# Patient Record
Sex: Female | Born: 1970 | Race: Black or African American | Hispanic: No | Marital: Single
Health system: Southern US, Community
[De-identification: ages and names within clinical notes are randomized; demographics above are authoritative.]

---

## 2010-04-10 ENCOUNTER — Encounter: Admission: RE | Admit: 2010-04-10 | Discharge: 2010-04-10 | Payer: Self-pay | Admitting: Podiatry

## 2014-03-10 DIAGNOSIS — R1900 Intra-abdominal and pelvic swelling, mass and lump, unspecified site: Secondary | ICD-10-CM | POA: Insufficient documentation

## 2014-05-19 DIAGNOSIS — D251 Intramural leiomyoma of uterus: Secondary | ICD-10-CM | POA: Insufficient documentation

## 2014-08-23 ENCOUNTER — Telehealth: Payer: Self-pay | Admitting: Critical Care Medicine

## 2014-08-23 NOTE — Telephone Encounter (Signed)
Spoke with [employer] of PFT need to be signed by PW and faxed back to [place]  I do not see that the PFT was scanned in yet  Do you have it, CJ? Please advise thanks!

## 2014-08-24 NOTE — Telephone Encounter (Signed)
lmomtcb on Misty White's named VM --  I do not see where we have seen this pt and there's no pending appt.   Who ordered this pt's PFT?

## 2014-08-25 NOTE — Telephone Encounter (Signed)
I spoke with Venida Jarvis - she will check on below and will call office back.

## 2014-08-29 NOTE — Telephone Encounter (Signed)
lmomtcb for Sherri to check on below.

## 2014-08-31 NOTE — Telephone Encounter (Signed)
Misty White returned call. She states they initially gave [place] wrong pt's name.  We would not have a pft result for this pt. Should not be any more follow ups.

## 2014-08-31 NOTE — Telephone Encounter (Signed)
LM for Misty White top return call

## 2014-08-31 NOTE — Telephone Encounter (Signed)
Will sign off on message since this is the incorrect patient.

## 2016-11-22 ENCOUNTER — Other Ambulatory Visit: Payer: Self-pay | Admitting: Orthopedic Surgery

## 2016-11-22 DIAGNOSIS — M533 Sacrococcygeal disorders, not elsewhere classified: Secondary | ICD-10-CM

## 2016-12-02 ENCOUNTER — Ambulatory Visit
Admission: RE | Admit: 2016-12-02 | Discharge: 2016-12-02 | Disposition: A | Discharge status: Home / Self Care | Payer: Worker's Compensation | Source: Ambulatory Visit | Attending: Orthopedic Surgery | Admitting: Orthopedic Surgery

## 2016-12-02 DIAGNOSIS — M533 Sacrococcygeal disorders, not elsewhere classified: Secondary | ICD-10-CM

## 2016-12-31 ENCOUNTER — Other Ambulatory Visit: Payer: Self-pay | Admitting: Orthopedic Surgery

## 2016-12-31 DIAGNOSIS — M533 Sacrococcygeal disorders, not elsewhere classified: Secondary | ICD-10-CM

## 2017-01-13 ENCOUNTER — Other Ambulatory Visit: Payer: Self-pay | Admitting: Orthopedic Surgery

## 2017-01-13 ENCOUNTER — Ambulatory Visit
Admission: RE | Admit: 2017-01-13 | Discharge: 2017-01-13 | Disposition: A | Discharge status: Home / Self Care | Payer: Worker's Compensation | Source: Ambulatory Visit | Attending: Orthopedic Surgery | Admitting: Orthopedic Surgery

## 2017-01-13 DIAGNOSIS — M533 Sacrococcygeal disorders, not elsewhere classified: Secondary | ICD-10-CM

## 2017-02-07 DIAGNOSIS — M222X2 Patellofemoral disorders, left knee: Secondary | ICD-10-CM | POA: Insufficient documentation

## 2017-10-29 IMAGING — CT CT BIOPSY
1 of 6 series · 8 of 32 positions shown, 13 images · non-contrast
Comparison: none

CLINICAL DATA: Bilateral posterior pelvic pain following fall at
work 8 months ago.

[Series 3: si joint local · axial · 0.98mm/px · z∈[-186,-116]mm · 8 of 47 slices shown, 13 images]
[im 6/47  soft-tissue]
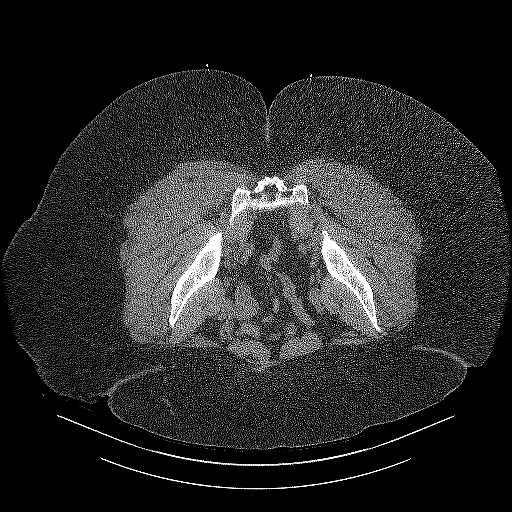
[im 6/47  bone]
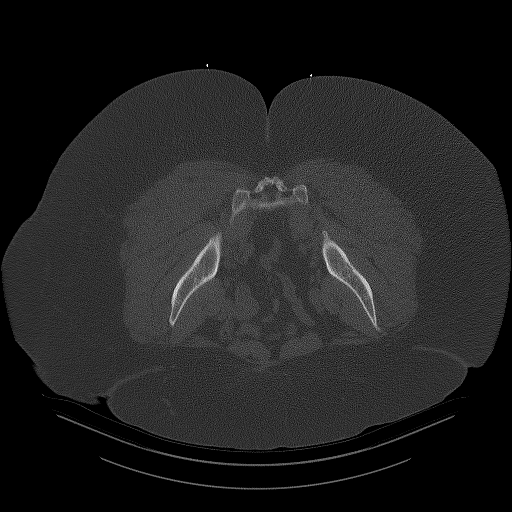
[im 11/47  soft-tissue]
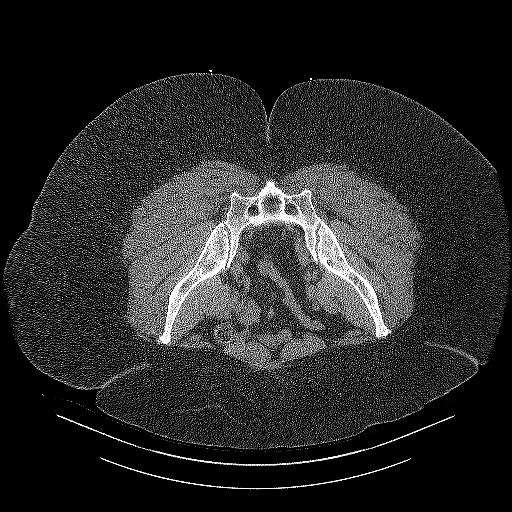
[im 16/47  soft-tissue]
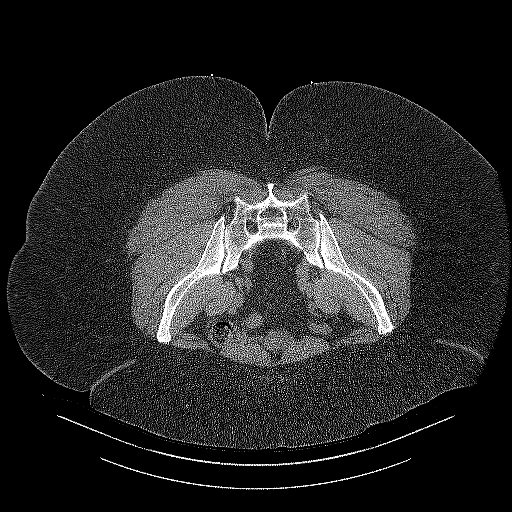
[im 21/47  soft-tissue]
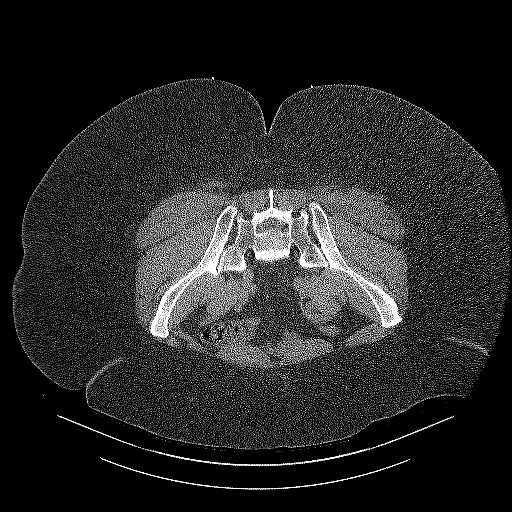
[im 26/47  soft-tissue]
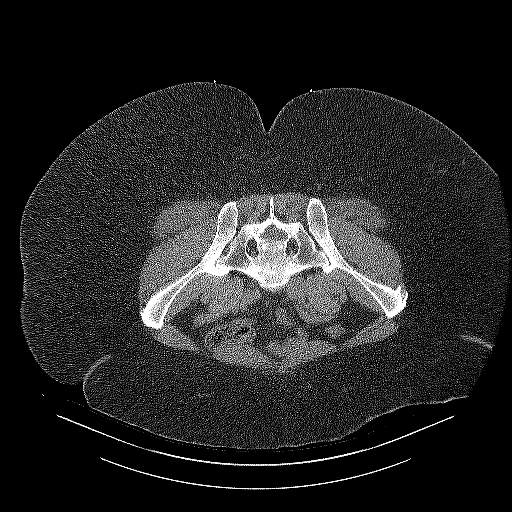
[im 26/47  lung]
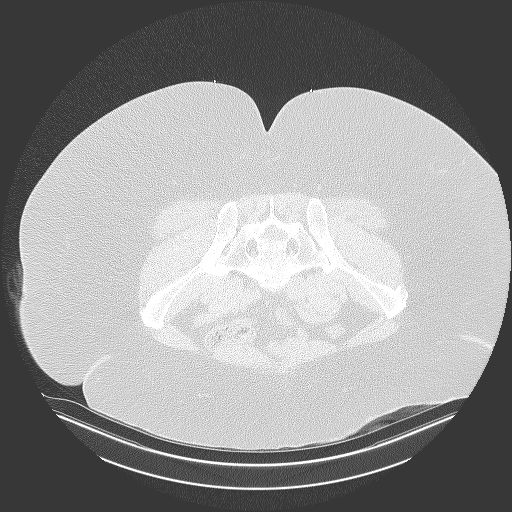
[im 31/47  soft-tissue]
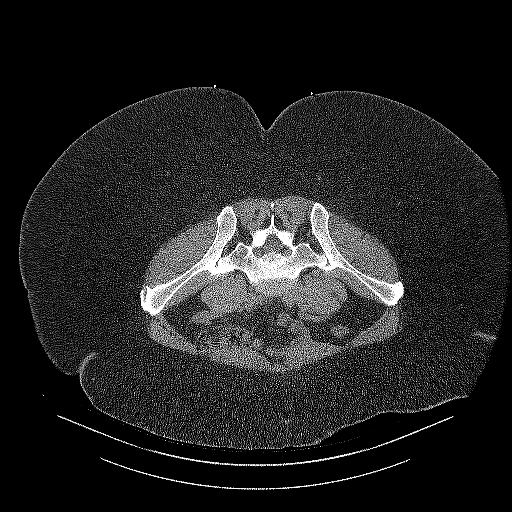
[im 31/47  lung]
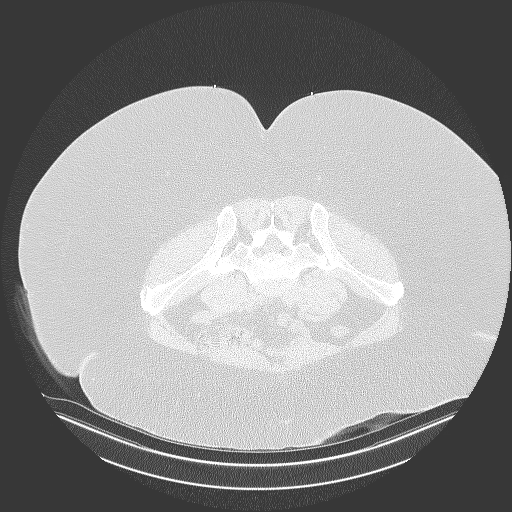
[im 36/47  soft-tissue]
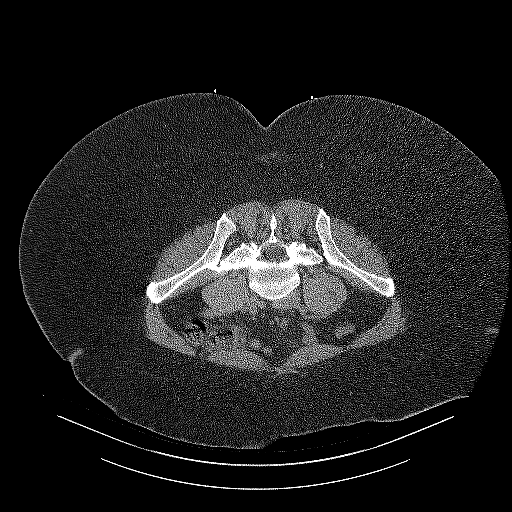
[im 36/47  lung]
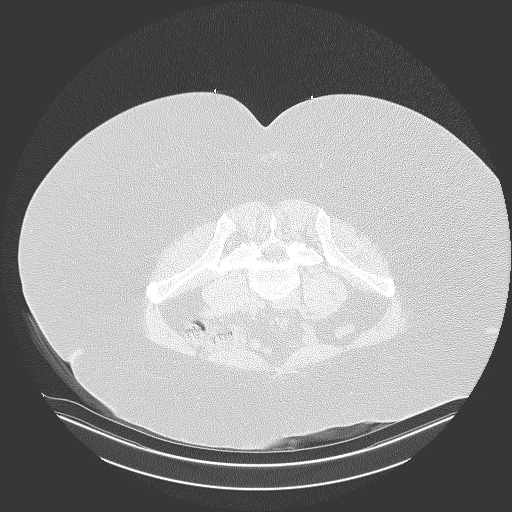
[im 41/47  soft-tissue]
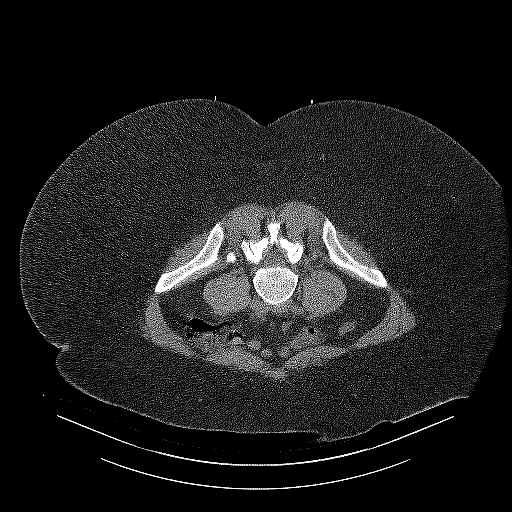
[im 41/47  lung]
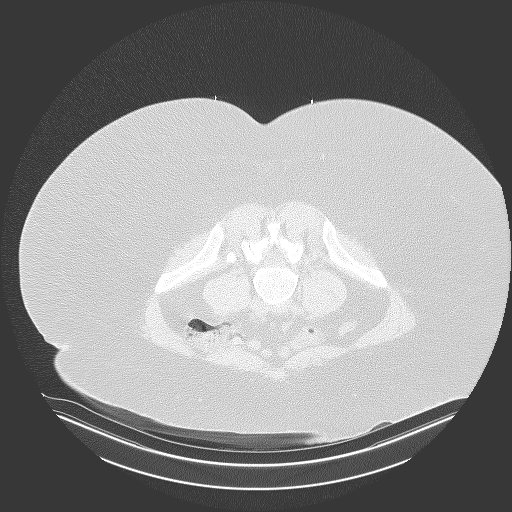

[8 of 32 positions shown; findings below may reference images not displayed]

EXAM:
Bilateral CT GUIDED SI JOINT INJECTION



After local anesthesia with 1% lidocaine without epinephrine and
subsequent deep anesthesia, a 22 gauge spinal needle was advanced
into the left SI joint under intermittent CT guidance. A second 22
gauge spinal needle was advanced into the right SI joint under
intermittent CT guidance at the same time.

Once the needles were in satisfactory position, a representative
image was captured with the needles demonstrated in the sacroiliac
joints. Subsequently, 2 mL 1% lidocaine was injected into the left
SI joint. 2 mL 1% lidocaine was then injected into the right SI
joint Needles or removed and a sterile dressing applied.

No complications were observed.
IMPRESSION: Successful CT-guided bilateral SI joint injection.

Lidocaine only was injected. The patient was advised to Our[facility] her
responses for 6 hours following the injection.

## 2020-02-18 ENCOUNTER — Ambulatory Visit: Payer: Self-pay | Admitting: Podiatry

## 2021-02-15 ENCOUNTER — Ambulatory Visit (INDEPENDENT_AMBULATORY_CARE_PROVIDER_SITE_OTHER): Payer: BC Managed Care – PPO

## 2021-02-15 ENCOUNTER — Ambulatory Visit: Payer: BC Managed Care – PPO | Admitting: Podiatry

## 2021-02-15 ENCOUNTER — Other Ambulatory Visit: Payer: Self-pay

## 2021-02-15 ENCOUNTER — Encounter: Payer: Self-pay | Admitting: Podiatry

## 2021-02-15 DIAGNOSIS — R102 Pelvic and perineal pain: Secondary | ICD-10-CM | POA: Insufficient documentation

## 2021-02-15 DIAGNOSIS — B9689 Other specified bacterial agents as the cause of diseases classified elsewhere: Secondary | ICD-10-CM | POA: Insufficient documentation

## 2021-02-15 DIAGNOSIS — M79672 Pain in left foot: Secondary | ICD-10-CM | POA: Diagnosis not present

## 2021-02-15 DIAGNOSIS — S4991XA Unspecified injury of right shoulder and upper arm, initial encounter: Secondary | ICD-10-CM | POA: Insufficient documentation

## 2021-02-15 DIAGNOSIS — M722 Plantar fascial fibromatosis: Secondary | ICD-10-CM

## 2021-02-15 DIAGNOSIS — M79671 Pain in right foot: Secondary | ICD-10-CM

## 2021-02-15 DIAGNOSIS — M216X9 Other acquired deformities of unspecified foot: Secondary | ICD-10-CM

## 2021-02-15 DIAGNOSIS — M62838 Other muscle spasm: Secondary | ICD-10-CM | POA: Insufficient documentation

## 2021-02-22 NOTE — Progress Notes (Signed)
  Subjective:  Patient ID: Misty White, female    DOB: Jan 18, 1971,  MRN: [id number]  Chief Complaint  Patient presents with   Foot Pain    I have some swelling and my feet feel like they are pulling and hurt in the heel area and on the sides    50 y.o. female presents with the above complaint. History confirmed with patient.  Reports painful areas to the bottom of both heels present for 1 month.  Reports cramping at night and pain going up the legs.  States that she stands on her feet for 8 hours at work and it hurts worse with standing.  Objective:  Physical Exam: warm, good capillary refill, no trophic changes or ulcerative lesions, normal DP and PT pulses, and normal sensory exam.  Pain palpation about both medial calcaneal tuber's mild pain at the arch decreased ankle joint range of motion bilateral   Radiographs: X-ray of both feet: no fracture, dislocation, swelling or degenerative changes noted Assessment:   1. Plantar fasciitis   2. Equinus deformity of foot      Plan:  Patient was evaluated and treated and all questions answered.  Plantar Fasciitis -XR reviewed with patient -Educated patient on stretching and icing of the affected limb -Plantar fascial brace dispensed x2  No follow-ups on file.

## 2021-03-08 ENCOUNTER — Ambulatory Visit (INDEPENDENT_AMBULATORY_CARE_PROVIDER_SITE_OTHER): Payer: BC Managed Care – PPO | Admitting: Podiatry

## 2021-03-08 DIAGNOSIS — Z5329 Procedure and treatment not carried out because of patient's decision for other reasons: Secondary | ICD-10-CM

## 2021-09-27 ENCOUNTER — Ambulatory Visit: Payer: BC Managed Care – PPO | Admitting: Podiatry

## 2021-10-01 ENCOUNTER — Ambulatory Visit: Payer: BC Managed Care – PPO | Admitting: Podiatry

## 2021-10-15 ENCOUNTER — Other Ambulatory Visit: Payer: Self-pay

## 2021-10-15 ENCOUNTER — Ambulatory Visit (INDEPENDENT_AMBULATORY_CARE_PROVIDER_SITE_OTHER): Payer: BC Managed Care – PPO | Admitting: Podiatry

## 2021-10-15 ENCOUNTER — Encounter: Payer: Self-pay | Admitting: Podiatry

## 2021-10-15 DIAGNOSIS — R5382 Chronic fatigue, unspecified: Secondary | ICD-10-CM | POA: Insufficient documentation

## 2021-10-15 DIAGNOSIS — M722 Plantar fascial fibromatosis: Secondary | ICD-10-CM | POA: Diagnosis not present

## 2021-10-15 DIAGNOSIS — E039 Hypothyroidism, unspecified: Secondary | ICD-10-CM | POA: Insufficient documentation

## 2021-10-15 DIAGNOSIS — E119 Type 2 diabetes mellitus without complications: Secondary | ICD-10-CM | POA: Insufficient documentation

## 2021-10-15 DIAGNOSIS — N390 Urinary tract infection, site not specified: Secondary | ICD-10-CM | POA: Insufficient documentation

## 2021-10-15 DIAGNOSIS — M216X9 Other acquired deformities of unspecified foot: Secondary | ICD-10-CM

## 2021-10-15 DIAGNOSIS — I10 Essential (primary) hypertension: Secondary | ICD-10-CM | POA: Insufficient documentation

## 2021-10-15 DIAGNOSIS — Z0000 Encounter for general adult medical examination without abnormal findings: Secondary | ICD-10-CM | POA: Insufficient documentation

## 2021-10-15 DIAGNOSIS — R059 Cough, unspecified: Secondary | ICD-10-CM | POA: Insufficient documentation

## 2021-10-15 DIAGNOSIS — E559 Vitamin D deficiency, unspecified: Secondary | ICD-10-CM | POA: Insufficient documentation

## 2021-10-15 DIAGNOSIS — D518 Other vitamin B12 deficiency anemias: Secondary | ICD-10-CM | POA: Insufficient documentation

## 2021-10-15 DIAGNOSIS — E782 Mixed hyperlipidemia: Secondary | ICD-10-CM | POA: Insufficient documentation

## 2021-10-15 MED ORDER — DEXAMETHASONE SODIUM PHOSPHATE 120 MG/30ML IJ SOLN: 4.0000 mg | Freq: Once | INTRAMUSCULAR | Status: AC

## 2021-10-15 NOTE — Progress Notes (Signed)
?  Subjective:  ?Patient ID: Misty White, female    DOB: 1971/06/05,   MRN: [id number] ? ?Chief Complaint  ?Patient presents with  ? Foot Pain  ?  Feet are terrible and heels are bad and pin and needles and shots did not help  ? ? ?51 y.o. female presents for follow-up of bilateral heel pain. Relates they have been really painful. The braces help a little but she is on her feet all day and that causes pain. She is to be undergoing weight loss surgery soon. She alos relates some pain with calluses and toenails.  . Denies any other pedal complaints. Denies n/v/f/c.  ? ?History reviewed. No pertinent past medical history. ? ?Objective:  ?Physical Exam: ?Vascular: DP/PT pulses 2/4 bilateral. CFT <3 seconds. Normal hair growth on digits. No edema.  ?Skin. No lacerations or abrasions bilateral feet.  ?Musculoskeletal: MMT 5/5 bilateral lower extremities in DF, PF, Inversion and Eversion. Deceased ROM in DF of ankle joint.  Bilateral medial calcaneal tubercles tender to palpation. No pain with calcaneal squeeze.  ?Neurological: Sensation intact to light touch.  ? ?Assessment:  ? ?1. Plantar fasciitis   ?2. Equinus deformity of foot   ? ? ? ?Plan:  ?Patient was evaluated and treated and all questions answered. ?Discussed plantar fasciitis with patient.  ?X-rays reviewed and discussed with patient. No acute fractures or dislocations noted. Mild spurring noted at inferior calcaneus.  ?Discussed treatment options including, ice, NSAIDS, supportive shoes, bracing, and stretching. ?Continue stretching and braces and anti-inflammatories.  ?Nails 1-5 debrided as courtesy.  ?Patient requesting injection today. Procedure note below.   ?Follow-up 6 weeks or sooner if any problems arise. In the meantime, encouraged to call the office with any questions, concerns, change in symptoms.  ? ?Procedure:  ?Discussed etiology, pathology, conservative vs. surgical therapies. At this time a plantar fascial injection was recommended.  The  patient agreed and a sterile skin prep was applied.  An injection consisting of  dexamethasone and marcaine mixture was infiltrated at the point of maximal tenderness on the bilateral Heel.  Bandaid applied. The patient tolerated this well and was given instructions for aftercare.  ? ? ?Lorenda Peck, DPM  ? ? ?

## 2021-11-26 ENCOUNTER — Ambulatory Visit (INDEPENDENT_AMBULATORY_CARE_PROVIDER_SITE_OTHER): Payer: BC Managed Care – PPO | Admitting: Podiatry

## 2021-11-26 DIAGNOSIS — Z91199 Patient's noncompliance with other medical treatment and regimen due to unspecified reason: Secondary | ICD-10-CM

## 2021-11-26 NOTE — Progress Notes (Signed)
No show

## 2021-12-03 ENCOUNTER — Ambulatory Visit (INDEPENDENT_AMBULATORY_CARE_PROVIDER_SITE_OTHER): Payer: BC Managed Care – PPO | Admitting: Podiatry

## 2021-12-03 ENCOUNTER — Encounter: Payer: Self-pay | Admitting: Podiatry

## 2021-12-03 DIAGNOSIS — M216X9 Other acquired deformities of unspecified foot: Secondary | ICD-10-CM

## 2021-12-03 DIAGNOSIS — M722 Plantar fascial fibromatosis: Secondary | ICD-10-CM | POA: Diagnosis not present

## 2021-12-03 NOTE — Progress Notes (Signed)
?  Subjective:  ?Patient ID: Misty White, female    DOB: September 11, 1970,   MRN: [id number] ? ?Chief Complaint  ?Patient presents with  ? Plantar Fasciitis  ?  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 ? ?51 y.o. female presents for follow-up of bilateral heel pain. Relates injection really helped and feet about 80% better. Does have a brain bleed that she may need surgery for and weight loss surgery is being postponed.  . Denies any other pedal complaints. Denies n/v/f/c.  ? ?History reviewed. No pertinent past medical history. ? ?Objective:  ?Physical Exam: ?Vascular: DP/PT pulses 2/4 bilateral. CFT <3 seconds. Normal hair growth on digits. No edema.  ?Skin. No lacerations or abrasions bilateral feet.  ?Musculoskeletal: MMT 5/5 bilateral lower extremities in DF, PF, Inversion and Eversion. Deceased ROM in DF of ankle joint.  Bilateral medial calcaneal tubercles minimally tender to palpation. No pain with calcaneal squeeze.  ?Neurological: Sensation intact to light touch.  ? ?Assessment:  ? ?1. Plantar fasciitis   ?2. Equinus deformity of foot   ? ? ? ? ?Plan:  ?Patient was evaluated and treated and all questions answered. ?Discussed plantar fasciitis with patient.  ?X-rays reviewed and discussed with patient. No acute fractures or dislocations noted. Mild spurring noted at inferior calcaneus.  ?Discussed treatment options including, ice, NSAIDS, supportive shoes, bracing, and stretching. ?Continue stretching and braces and anti-inflammatories.   ?Patient requesting injection today. Procedure note below.   ?Follow-up AS NEEDED ? ?Lorenda Peck, DPM  ? ? ?
